# Patient Record
Sex: Male | Born: 1945 | Race: White | Hispanic: No | Marital: Married | Smoking: Never smoker
Health system: Southern US, Community
[De-identification: ages and names within clinical notes are randomized; demographics above are authoritative.]

## PROBLEM LIST (undated history)

## (undated) HISTORY — PX: APPENDECTOMY: SHX54

---

## 2019-09-06 ENCOUNTER — Ambulatory Visit: Payer: Medicare Other | Attending: Internal Medicine

## 2019-09-06 DIAGNOSIS — Z23 Encounter for immunization: Secondary | ICD-10-CM | POA: Insufficient documentation

## 2019-09-06 NOTE — Progress Notes (Signed)
   Covid-19 Vaccination Clinic  Name:  David Cooper    MRN: [id number] DOB: 1946/02/05  09/06/2019  [name] was observed post Covid-19 immunization for 15 minutes without incidence. He was provided with Vaccine Information Sheet and instruction to access the V-Safe system.   [name] was instructed to call 911 with any severe reactions post vaccine: Marland Kitchen Difficulty breathing  . Swelling of your face and throat  . A fast heartbeat  . A bad rash all over your body  . Dizziness and weakness    Immunizations Administered    Name Date Dose VIS Date Route   Pfizer COVID-19 Vaccine 09/06/2019 12:07 PM 0.3 mL 07/03/2019 Intramuscular   Manufacturer: [place]   Lot: [id number]   [place]: [id number]

## 2019-09-29 ENCOUNTER — Ambulatory Visit: Payer: Medicare Other | Attending: Internal Medicine

## 2019-09-29 DIAGNOSIS — Z23 Encounter for immunization: Secondary | ICD-10-CM | POA: Insufficient documentation

## 2019-09-29 NOTE — Progress Notes (Signed)
   Covid-19 Vaccination Clinic  Name:  David Cooper    MRN: [id number] DOB: Nov 15, 1945  09/29/2019  [name] was observed post Covid-19 immunization for 15 minutes without incident. He was provided with Vaccine Information Sheet and instruction to access the V-Safe system.   [name] was instructed to call 911 with any severe reactions post vaccine: Marland Kitchen Difficulty breathing  . Swelling of face and throat  . A fast heartbeat  . A bad rash all over body  . Dizziness and weakness   Immunizations Administered    Name Date Dose VIS Date Route   Pfizer COVID-19 Vaccine 09/29/2019  4:17 PM 0.3 mL 07/03/2019 Intramuscular   Manufacturer: [place]   Lot: [id number]   [place]: [id number]

## 2020-07-26 DIAGNOSIS — Z0000 Encounter for general adult medical examination without abnormal findings: Secondary | ICD-10-CM | POA: Diagnosis not present

## 2020-07-26 DIAGNOSIS — E7800 Pure hypercholesterolemia, unspecified: Secondary | ICD-10-CM | POA: Diagnosis not present

## 2020-07-26 DIAGNOSIS — M109 Gout, unspecified: Secondary | ICD-10-CM | POA: Diagnosis not present

## 2020-07-26 DIAGNOSIS — Z79899 Other long term (current) drug therapy: Secondary | ICD-10-CM | POA: Diagnosis not present

## 2020-07-26 DIAGNOSIS — Z23 Encounter for immunization: Secondary | ICD-10-CM | POA: Diagnosis not present

## 2020-09-23 DIAGNOSIS — D2261 Melanocytic nevi of right upper limb, including shoulder: Secondary | ICD-10-CM | POA: Diagnosis not present

## 2020-09-23 DIAGNOSIS — L820 Inflamed seborrheic keratosis: Secondary | ICD-10-CM | POA: Diagnosis not present

## 2020-09-23 DIAGNOSIS — D225 Melanocytic nevi of trunk: Secondary | ICD-10-CM | POA: Diagnosis not present

## 2020-09-23 DIAGNOSIS — L821 Other seborrheic keratosis: Secondary | ICD-10-CM | POA: Diagnosis not present

## 2020-09-23 DIAGNOSIS — D2262 Melanocytic nevi of left upper limb, including shoulder: Secondary | ICD-10-CM | POA: Diagnosis not present

## 2020-09-23 DIAGNOSIS — D1801 Hemangioma of skin and subcutaneous tissue: Secondary | ICD-10-CM | POA: Diagnosis not present

## 2021-01-27 ENCOUNTER — Emergency Department (HOSPITAL_BASED_OUTPATIENT_CLINIC_OR_DEPARTMENT_OTHER)
Admission: EM | Admit: 2021-01-27 | Discharge: 2021-01-27 | Disposition: A | Discharge status: Home / Self Care | Payer: Medicare Other | Attending: Emergency Medicine | Admitting: Emergency Medicine

## 2021-01-27 ENCOUNTER — Emergency Department (HOSPITAL_BASED_OUTPATIENT_CLINIC_OR_DEPARTMENT_OTHER): Payer: Medicare Other | Admitting: Radiology

## 2021-01-27 ENCOUNTER — Other Ambulatory Visit: Payer: Self-pay

## 2021-01-27 ENCOUNTER — Encounter (HOSPITAL_BASED_OUTPATIENT_CLINIC_OR_DEPARTMENT_OTHER): Payer: Self-pay | Admitting: *Deleted

## 2021-01-27 DIAGNOSIS — S93115A Dislocation of interphalangeal joint of left lesser toe(s), initial encounter: Secondary | ICD-10-CM | POA: Insufficient documentation

## 2021-01-27 DIAGNOSIS — W228XXA Striking against or struck by other objects, initial encounter: Secondary | ICD-10-CM | POA: Diagnosis not present

## 2021-01-27 DIAGNOSIS — S63237A Subluxation of proximal interphalangeal joint of left little finger, initial encounter: Secondary | ICD-10-CM | POA: Diagnosis not present

## 2021-01-27 DIAGNOSIS — S99922A Unspecified injury of left foot, initial encounter: Secondary | ICD-10-CM | POA: Diagnosis present

## 2021-01-27 DIAGNOSIS — S93105A Unspecified dislocation of left toe(s), initial encounter: Secondary | ICD-10-CM

## 2021-01-27 MED ORDER — BUPIVACAINE HCL 0.25 % IJ SOLN: 5.0000 mL | Freq: Once | INTRAMUSCULAR | Status: DC

## 2021-01-27 MED ORDER — BUPIVACAINE HCL (PF) 0.25 % IJ SOLN
5.0000 mL | Freq: Once | INTRAMUSCULAR | Status: AC
  Administered 2021-01-27: 5 mL
  Filled 2021-01-27: qty 30

## 2021-01-27 NOTE — ED Triage Notes (Signed)
Pt caught left 5th toe against door way. ? Dislocated toe. Red in color, No swelling

## 2021-01-27 NOTE — ED Provider Notes (Signed)
[facility] Provider Note   CSN: [id number] Arrival date & time: 01/27/21  1553     History Chief Complaint  Patient presents with   Toe Injury    David Cooper is a 75 y.o. male.  The history is provided by the patient.  Patient presents with left fifth toe injury.  States he hit it on a door jam.  States it was pointing out.  States he tried to pull it back up but it was very painful and states that it would point back out again.  Skin intact.  States he has dislocated his toe previously.  No other injury.    History reviewed. No pertinent past medical history.  There are no problems to display for this patient.   Past Surgical History:  Procedure Laterality Date   APPENDECTOMY         No family history on file.  Social History   Tobacco Use   Smoking status: Never   Smokeless tobacco: Never  Substance Use Topics   Alcohol use: Yes   Drug use: Never    Home Medications Prior to Admission medications   Not on File    Allergies    Bee venom  Review of Systems   Review of Systems  Musculoskeletal:        Left fifth toe injury.  Skin:  Negative for wound.  Neurological:  Negative for weakness and numbness.  Psychiatric/Behavioral:  Negative for confusion.    Physical Exam Updated Vital Signs BP (!) 183/102 (BP Location: Right Arm)   Pulse (!) 58   Temp 98.4 F (36.9 C)   Resp 16   Ht 6' 2.5" (1.892 m)   Wt 89.8 kg   SpO2 98%   BMI 25.08 kg/m   Physical Exam Vitals reviewed.  Cardiovascular:     Rate and Rhythm: Regular rhythm.  Musculoskeletal:     Comments: Tenderness over left fifth toe with some lateral angulation of the distal part of the toe  Skin:    Capillary Refill: Capillary refill takes less than 2 seconds.     Comments: Skin intact.  Neurological:     Mental Status: He is alert and oriented to person, place, and time.    ED Results / Procedures / Treatments   Labs (all labs ordered are  listed, but only abnormal results are displayed) Labs Reviewed - No data to display  EKG None  Radiology DG Toe 5th Left  Result Date: 01/27/2021 CLINICAL DATA:  Fifth digit pain EXAM: DG TOE 5TH LEFT COMPARISON:  None. FINDINGS: Lateral subluxation base of fifth middle phalanx with respect to head of proximal phalanx. No definitive fracture lucency is seen. Positive for soft tissue swelling. IMPRESSION: Lateral subluxation at the fifth PIP joint Electronically Signed   By: Donavan Foil M.D.   On: 01/27/2021 16:42    Procedures Procedures   Medications Ordered in ED Medications  bupivacaine (PF) (MARCAINE) 0.25 % injection 5 mL (5 mLs Infiltration Given 01/27/21 1731)    ED Course  I have reviewed the triage vital signs and the nursing notes.  Pertinent labs & imaging results that were available during my care of the patient were reviewed by me and considered in my medical decision making (see chart for details).    MDM Rules/Calculators/A&P                         Patient with left fifth toe injury.  Subluxation at PIP joint.  I think likely had been dislocated and now just subluxed.  Reduced and buddy taped.  Also postop shoe.  Ortho follow-up.  Discharge home.  Skin intact.  Final Clinical Impression(s) / ED Diagnoses Final diagnoses:  Toe dislocation, left, initial encounter    Rx / DC Orders ED Discharge Orders     None        Davonna Belling, MD 01/27/21 [phone]

## 2021-08-17 DIAGNOSIS — Z79899 Other long term (current) drug therapy: Secondary | ICD-10-CM | POA: Diagnosis not present

## 2021-08-17 DIAGNOSIS — M109 Gout, unspecified: Secondary | ICD-10-CM | POA: Diagnosis not present

## 2021-08-17 DIAGNOSIS — E7800 Pure hypercholesterolemia, unspecified: Secondary | ICD-10-CM | POA: Diagnosis not present

## 2021-08-22 DIAGNOSIS — Z0000 Encounter for general adult medical examination without abnormal findings: Secondary | ICD-10-CM | POA: Diagnosis not present

## 2021-08-22 DIAGNOSIS — M109 Gout, unspecified: Secondary | ICD-10-CM | POA: Diagnosis not present

## 2021-08-22 DIAGNOSIS — I10 Essential (primary) hypertension: Secondary | ICD-10-CM | POA: Diagnosis not present

## 2021-09-06 DIAGNOSIS — I10 Essential (primary) hypertension: Secondary | ICD-10-CM | POA: Diagnosis not present

## 2021-10-18 DIAGNOSIS — L738 Other specified follicular disorders: Secondary | ICD-10-CM | POA: Diagnosis not present

## 2021-10-18 DIAGNOSIS — L821 Other seborrheic keratosis: Secondary | ICD-10-CM | POA: Diagnosis not present

## 2021-10-18 DIAGNOSIS — D2262 Melanocytic nevi of left upper limb, including shoulder: Secondary | ICD-10-CM | POA: Diagnosis not present

## 2021-10-18 DIAGNOSIS — D1801 Hemangioma of skin and subcutaneous tissue: Secondary | ICD-10-CM | POA: Diagnosis not present

## 2021-10-18 DIAGNOSIS — L570 Actinic keratosis: Secondary | ICD-10-CM | POA: Diagnosis not present

## 2022-08-28 DIAGNOSIS — E7800 Pure hypercholesterolemia, unspecified: Secondary | ICD-10-CM | POA: Diagnosis not present

## 2022-08-28 DIAGNOSIS — I10 Essential (primary) hypertension: Secondary | ICD-10-CM | POA: Diagnosis not present

## 2022-08-28 DIAGNOSIS — M109 Gout, unspecified: Secondary | ICD-10-CM | POA: Diagnosis not present

## 2022-08-30 DIAGNOSIS — I10 Essential (primary) hypertension: Secondary | ICD-10-CM | POA: Diagnosis not present

## 2022-08-30 DIAGNOSIS — M109 Gout, unspecified: Secondary | ICD-10-CM | POA: Diagnosis not present

## 2022-08-30 DIAGNOSIS — E7800 Pure hypercholesterolemia, unspecified: Secondary | ICD-10-CM | POA: Diagnosis not present

## 2022-08-30 DIAGNOSIS — H6980 Other specified disorders of Eustachian tube, unspecified ear: Secondary | ICD-10-CM | POA: Diagnosis not present

## 2022-08-30 DIAGNOSIS — Z0000 Encounter for general adult medical examination without abnormal findings: Secondary | ICD-10-CM | POA: Diagnosis not present

## 2022-08-30 DIAGNOSIS — Z79899 Other long term (current) drug therapy: Secondary | ICD-10-CM | POA: Diagnosis not present

## 2022-09-12 DIAGNOSIS — Z86010 Personal history of colonic polyps: Secondary | ICD-10-CM | POA: Diagnosis not present

## 2022-10-09 DIAGNOSIS — Z1211 Encounter for screening for malignant neoplasm of colon: Secondary | ICD-10-CM | POA: Diagnosis not present

## 2022-10-09 DIAGNOSIS — Z86010 Personal history of colonic polyps: Secondary | ICD-10-CM | POA: Diagnosis not present

## 2022-10-16 IMAGING — DX DG TOE 5TH 2+V*L*
1 series · 3 of 3 positions shown · non-contrast
Comparison: None.

CLINICAL DATA: Fifth digit pain

EXAM:
DG TOE 5TH LEFT

[Series 1: toe · 0.14mm/px · 3 of 3 slices shown]
[im 1/3]
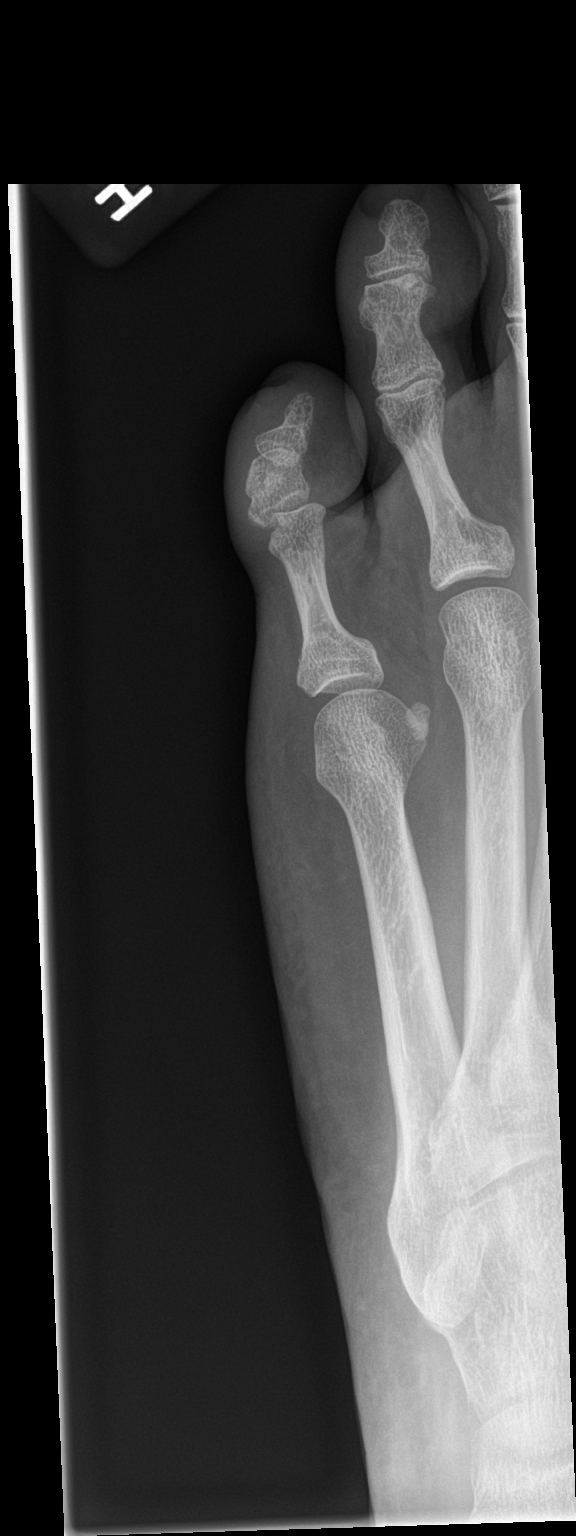
[im 2/3]
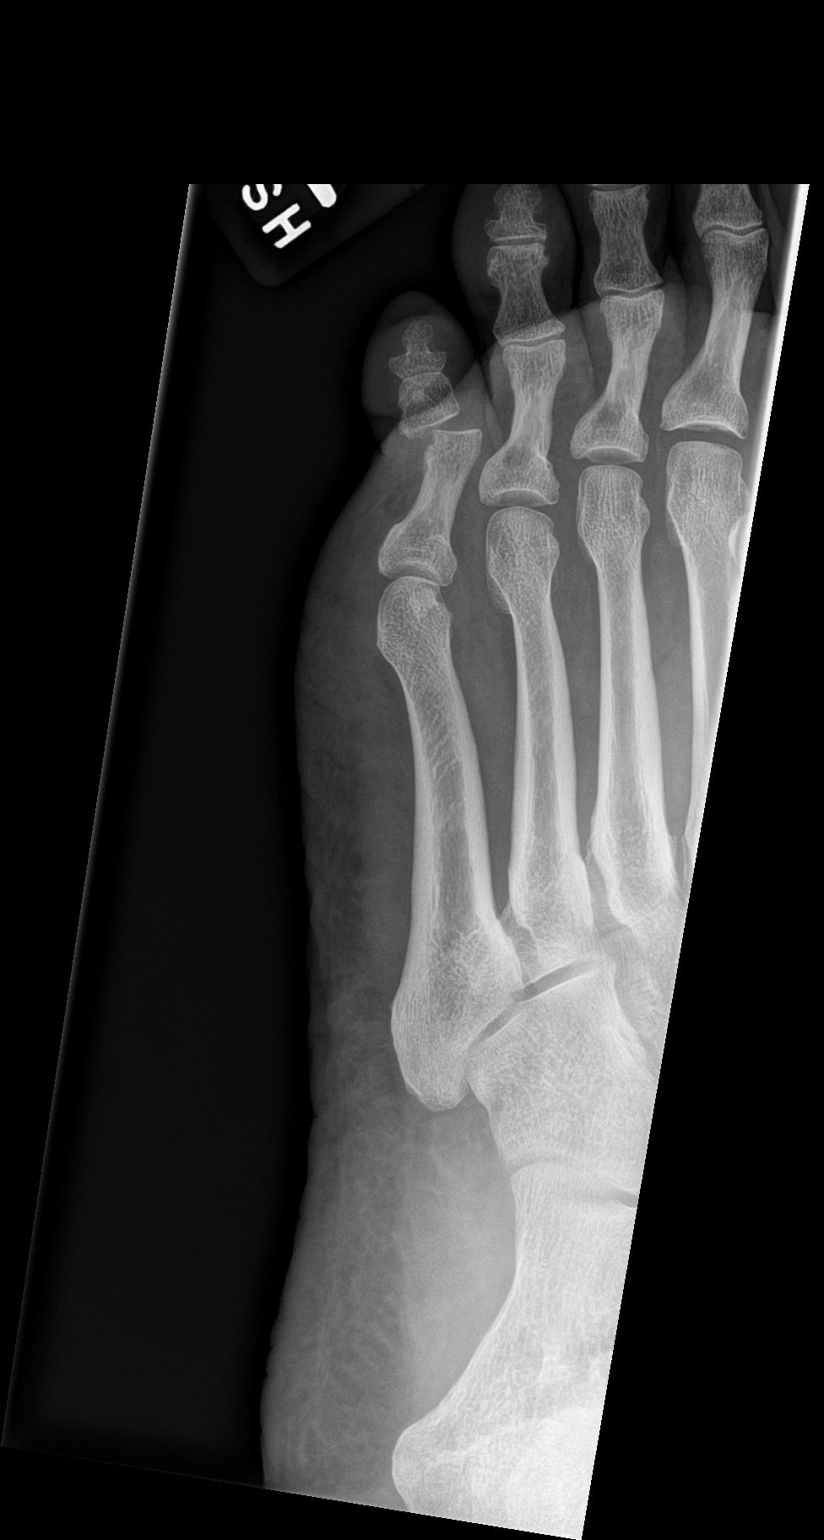
[im 3/3]
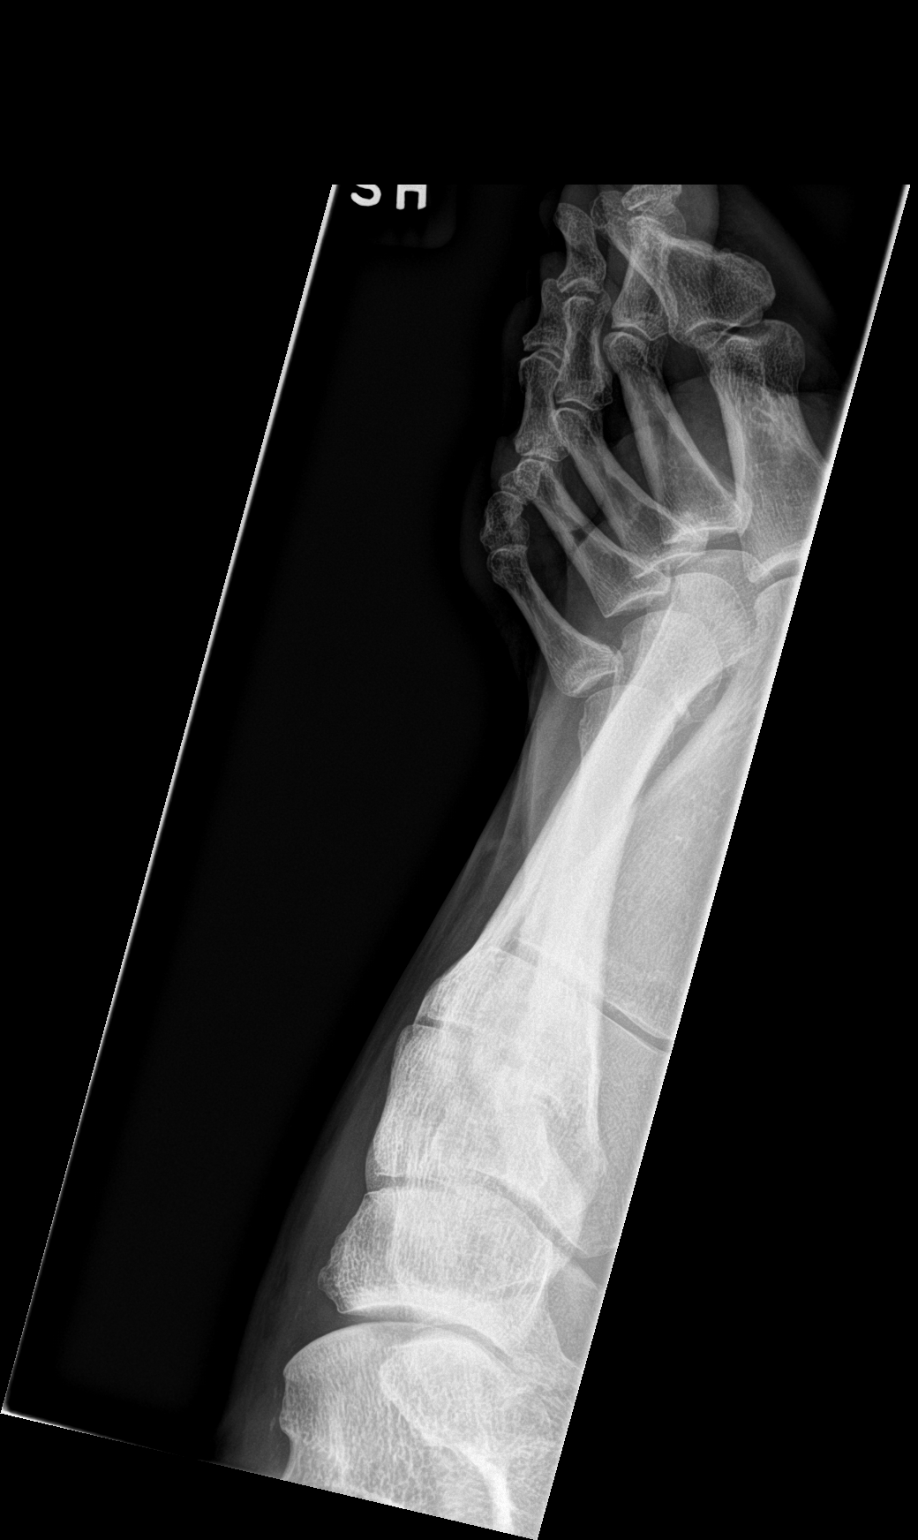

[3 of 3 positions shown; findings below may reference images not displayed]

FINDINGS: Lateral subluxation base of fifth middle phalanx with respect to
head of proximal phalanx. No definitive fracture lucency is seen.
Positive for soft tissue swelling.
IMPRESSION: Lateral subluxation at the fifth PIP joint

## 2022-12-31 DIAGNOSIS — L918 Other hypertrophic disorders of the skin: Secondary | ICD-10-CM | POA: Diagnosis not present

## 2022-12-31 DIAGNOSIS — D2262 Melanocytic nevi of left upper limb, including shoulder: Secondary | ICD-10-CM | POA: Diagnosis not present

## 2022-12-31 DIAGNOSIS — L821 Other seborrheic keratosis: Secondary | ICD-10-CM | POA: Diagnosis not present

## 2022-12-31 DIAGNOSIS — D1801 Hemangioma of skin and subcutaneous tissue: Secondary | ICD-10-CM | POA: Diagnosis not present

## 2023-01-23 DIAGNOSIS — R109 Unspecified abdominal pain: Secondary | ICD-10-CM | POA: Diagnosis not present

## 2023-01-23 DIAGNOSIS — I10 Essential (primary) hypertension: Secondary | ICD-10-CM | POA: Diagnosis not present

## 2023-01-23 DIAGNOSIS — R3911 Hesitancy of micturition: Secondary | ICD-10-CM | POA: Diagnosis not present

## 2023-09-05 DIAGNOSIS — M109 Gout, unspecified: Secondary | ICD-10-CM | POA: Diagnosis not present

## 2023-09-05 DIAGNOSIS — E7800 Pure hypercholesterolemia, unspecified: Secondary | ICD-10-CM | POA: Diagnosis not present

## 2023-09-05 DIAGNOSIS — I10 Essential (primary) hypertension: Secondary | ICD-10-CM | POA: Diagnosis not present

## 2023-09-23 DIAGNOSIS — R251 Tremor, unspecified: Secondary | ICD-10-CM | POA: Diagnosis not present

## 2023-09-23 DIAGNOSIS — M109 Gout, unspecified: Secondary | ICD-10-CM | POA: Diagnosis not present

## 2023-09-23 DIAGNOSIS — Z79899 Other long term (current) drug therapy: Secondary | ICD-10-CM | POA: Diagnosis not present

## 2023-09-23 DIAGNOSIS — I10 Essential (primary) hypertension: Secondary | ICD-10-CM | POA: Diagnosis not present

## 2023-09-23 DIAGNOSIS — Z0000 Encounter for general adult medical examination without abnormal findings: Secondary | ICD-10-CM | POA: Diagnosis not present

## 2023-09-23 DIAGNOSIS — R3911 Hesitancy of micturition: Secondary | ICD-10-CM | POA: Diagnosis not present

## 2023-09-23 DIAGNOSIS — H6980 Other specified disorders of Eustachian tube, unspecified ear: Secondary | ICD-10-CM | POA: Diagnosis not present

## 2023-09-23 DIAGNOSIS — E7800 Pure hypercholesterolemia, unspecified: Secondary | ICD-10-CM | POA: Diagnosis not present

## 2023-10-14 ENCOUNTER — Telehealth (INDEPENDENT_AMBULATORY_CARE_PROVIDER_SITE_OTHER): Payer: Self-pay | Admitting: Otolaryngology

## 2023-10-14 NOTE — Telephone Encounter (Signed)
 Reminder Call:  Date: 10/15/2023 Status: Sch  Time: 1:40 PM Confirmed time and location-[address] [place], [place] [address]

## 2023-10-15 ENCOUNTER — Ambulatory Visit (INDEPENDENT_AMBULATORY_CARE_PROVIDER_SITE_OTHER): Admitting: Audiology

## 2023-10-15 ENCOUNTER — Encounter (INDEPENDENT_AMBULATORY_CARE_PROVIDER_SITE_OTHER): Payer: Self-pay

## 2023-10-15 ENCOUNTER — Ambulatory Visit (INDEPENDENT_AMBULATORY_CARE_PROVIDER_SITE_OTHER): Admitting: Otolaryngology

## 2023-10-15 VITALS — BP 146/78 | HR 67 | Ht 74.5 in | Wt 197.0 lb

## 2023-10-15 DIAGNOSIS — H903 Sensorineural hearing loss, bilateral: Secondary | ICD-10-CM

## 2023-10-15 DIAGNOSIS — H9313 Tinnitus, bilateral: Secondary | ICD-10-CM

## 2023-10-15 NOTE — Progress Notes (Unsigned)
 Patient ID: David Cooper, male   DOB: 27-Jan-1946, 78 y.o.   MRN: [id number]  CC: Bilateral tinnitus, hearing loss  HPI:  David Cooper is a 78 y.o. male who presents today complaining of bilateral tinnitus for 5+ years.  He describes the tinnitus as a high-pitched ringing noise.  He also complains of hearing difficulty, especially in noisy environments.  He has no previous ENT surgery.  He has never worn hearing aids.  Currently he denies any otalgia, otorrhea, or vertigo.  He has a history of environmental allergies.  He uses Flonase nasal spray.  No past medical history on file.  Past Surgical History:  Procedure Laterality Date   APPENDECTOMY      No family history on file.  Social History:  reports that he has never smoked. He has never used smokeless tobacco. He reports current alcohol use. He reports that he does not use drugs.  Allergies:  Allergies  Allergen Reactions   Bee Venom     Prior to Admission medications   Medication Sig Start Date End Date Taking? Authorizing Provider  allopurinol (ZYLOPRIM) 100 MG tablet Take 100 mg by mouth 2 (two) times daily. 08/19/23  Yes [provider]  b complex vitamins capsule Take 1 capsule by mouth daily.   Yes [provider]  Multiple Vitamin (MULTIVITAMIN) tablet Take 1 tablet by mouth daily.   Yes [provider]  tamsulosin (FLOMAX) 0.4 MG CAPS capsule Take 0.4 mg by mouth daily. 08/19/23  Yes [provider]  valsartan (DIOVAN) 320 MG tablet Take 320 mg by mouth daily. 08/20/23  Yes [provider]  zinc gluconate 50 MG tablet Take 50 mg by mouth daily.   Yes [provider]    Blood pressure (!) 146/78, pulse 67, height 6' 2.5" (1.892 m), weight 197 lb (89.4 kg), SpO2 96%. Exam: General: Communicates without difficulty, well nourished, no acute distress. Head: Normocephalic, no evidence injury, no tenderness, facial buttresses intact without stepoff. Face/sinus: No  tenderness to palpation and percussion. Facial movement is normal and symmetric. Eyes: PERRL, EOMI. No scleral icterus, conjunctivae clear. Neuro: CN II exam reveals vision grossly intact.  No nystagmus at any point of gaze. Ears: Auricles well formed without lesions.  Ear canals are intact without mass or lesion.  No erythema or edema is appreciated.  The TMs are intact without fluid. Nose: External evaluation reveals normal support and skin without lesions.  Dorsum is intact.  Anterior rhinoscopy reveals congested mucosa over anterior aspect of inferior turbinates and intact septum.  No purulence noted. Oral:  Oral cavity and oropharynx are intact, symmetric, without erythema or edema.  Mucosa is moist without lesions. Neck: Full range of motion without pain.  There is no significant lymphadenopathy.  No masses palpable.  Thyroid bed within normal limits to palpation.  Parotid glands and submandibular glands equal bilaterally without mass.  Trachea is midline. Neuro:  CN 2-12 grossly intact.   His hearing test shows bilateral symmetric high-frequency sensorineural hearing loss.  Assessment: 1.  Bilateral high-frequency sensorineural hearing loss, likely secondary to presbycusis. 2.  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  Plan: 1.  The physical exam findings and the hearing test results are reviewed with the patient. 2.  The strategies to cope with tinnitus, including the use of masker, hearing aids, tinnitus retraining therapy, and avoidance of caffeine and alcohol are discussed.  3.  The patient is a candidate for hearing amplification. 4.  The patient will return for reevaluation in 1 year.  Jay Haskew W Aloysius Heinle 10/15/2023, 2:11 PM

## 2023-10-16 DIAGNOSIS — H903 Sensorineural hearing loss, bilateral: Secondary | ICD-10-CM | POA: Insufficient documentation

## 2023-10-16 DIAGNOSIS — H9313 Tinnitus, bilateral: Secondary | ICD-10-CM | POA: Insufficient documentation

## 2023-10-16 NOTE — Progress Notes (Signed)
  [address] [place], [place] [address] [phone]  Audiological Evaluation    Name: David Cooper     DOB:   May 26, 1946      MRN:   [id number]                                                                                     Service Date: 10/16/2023     Accompanied by: unaccompanied    Patient comes today after Dr. Suszanne Conners, ENT sent a referral for a hearing evaluation due to concerns with tinnitus.   Symptoms Yes Details  Hearing loss  []  Per his wife  Tinnitus  [x]  Both ears  Ear pain/ infections/pressure  []    Balance problems  []    Noise exposure history  []    Previous ear surgeries  []    Family history of hearing loss  []    Amplification  []    Other  []      Otoscopy: Right ear: Clear external ear canals and notable landmarks visualized on the tympanic membrane. Left ear:  Clear external ear canals and notable landmarks visualized on the tympanic membrane.  Tympanometry: Right ear: Type A- Normal external ear canal volume with normal middle ear pressure and tympanic membrane compliance Left ear: Type A- Normal external ear canal volume with normal middle ear pressure and tympanic membrane compliance    Pure tone Audiometry: Both ears- Normal sloping to severe sensorineural hearing loss from 250 Hz - 8000 Hz.    Speech Audiometry: Right ear- Speech Reception Threshold (SRT) was obtained at 25 dBHL. Left ear-Speech Reception Threshold (SRT) was obtained at 25 dBHL.   Word Recognition Score Tested using NU-6 (MLV) Right ear: 92% was obtained at a presentation level of 80 dBHL with contralateral masking which is deemed as  excellent. Left ear: 88% was obtained at a presentation level of 80 dBHL with contralateral masking which is deemed as  excellent.   The hearing test results were completed under headphones and re-checked with inserts and results are deemed to be of good reliability. Test technique:  conventional     Recommendations: Follow up  with ENT as scheduled for today. Return for a hearing evaluation if concerns with hearing changes arise or per MD recommendation.  Consider a communication needs assessment after medical clearance for hearing aids is obtained, pending patient motivation.   Alfred Eckley MARIE LEROUX-MARTINEZ, AUD

## 2024-01-22 DIAGNOSIS — M25519 Pain in unspecified shoulder: Secondary | ICD-10-CM | POA: Diagnosis not present

## 2024-05-11 DIAGNOSIS — D1801 Hemangioma of skin and subcutaneous tissue: Secondary | ICD-10-CM | POA: Diagnosis not present

## 2024-05-11 DIAGNOSIS — D2262 Melanocytic nevi of left upper limb, including shoulder: Secondary | ICD-10-CM | POA: Diagnosis not present

## 2024-05-11 DIAGNOSIS — L821 Other seborrheic keratosis: Secondary | ICD-10-CM | POA: Diagnosis not present
